# Patient Record
Sex: Male | Born: 1990
Health system: Southern US, Community
[De-identification: ages and names within clinical notes are randomized; demographics above are authoritative.]

## PROBLEM LIST (undated history)

## (undated) HISTORY — PX: WRIST SURGERY: SHX841

## (undated) HISTORY — PX: FOOT SURGERY: SHX648

---
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
## 2010-01-11 IMAGING — CT CT OF THE RIGHT SHOULDER WITHOUT CONTRAST
3 series · 16 of 33 positions shown, 19 images · non-contrast
Comparison: none

REASON FOR EXAM: RT shoulder pain  injury  AC joint separation need
report asap FU appt w dr ...
COMMENTS:

[Series 2: shoulder 2.0 b31s · axial · 0.48mm/px · z∈[+566,+654]mm · 8 of 53 slices shown, 10 images]
[im 5/53  soft-tissue]
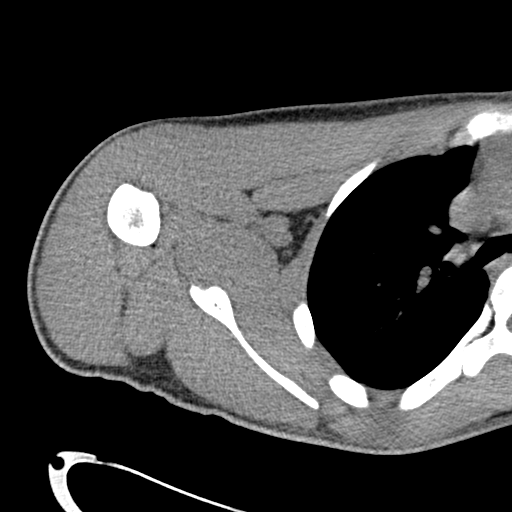
[im 5/53  bone]
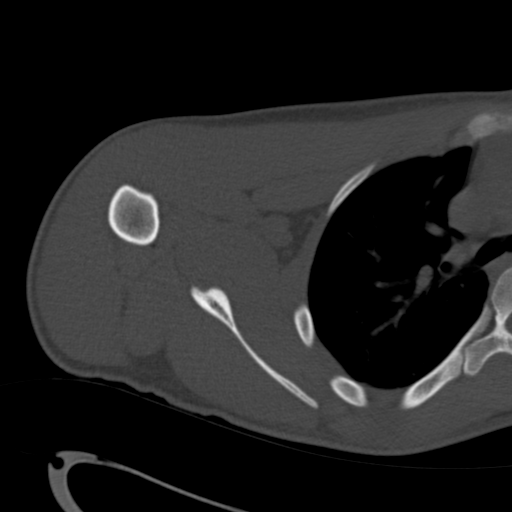
[im 13/53  bone]
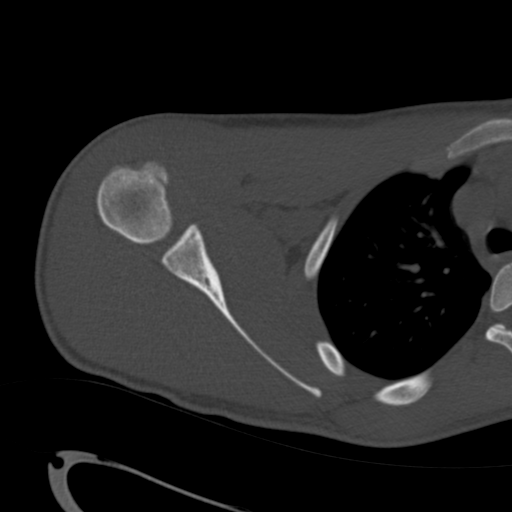
[im 17/53  bone]
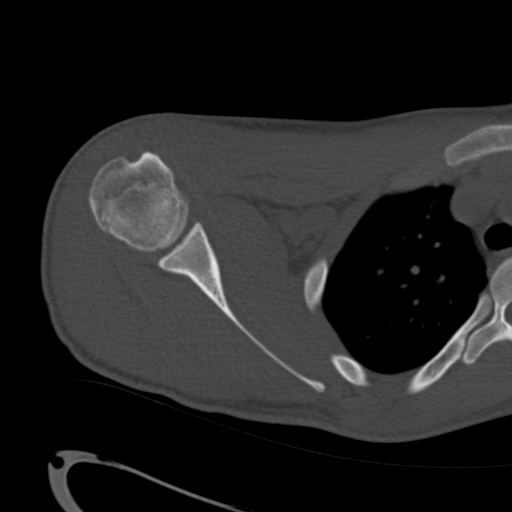
[im 25/53  bone]
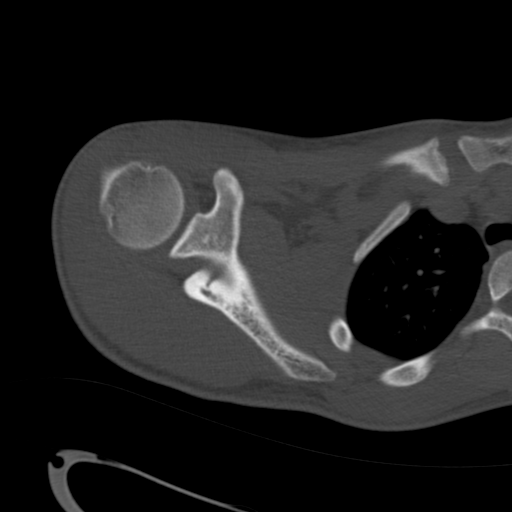
[im 29/53  soft-tissue]
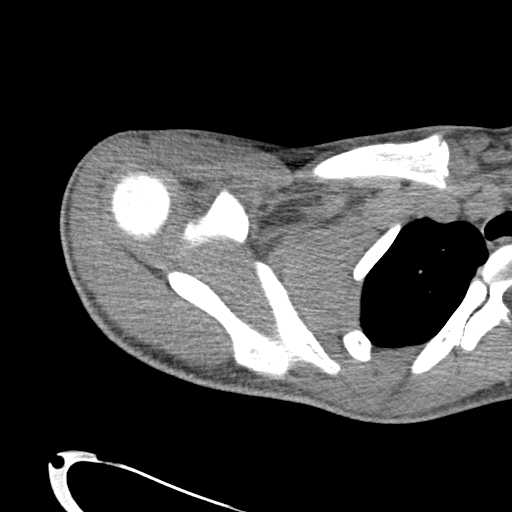
[im 29/53  bone]
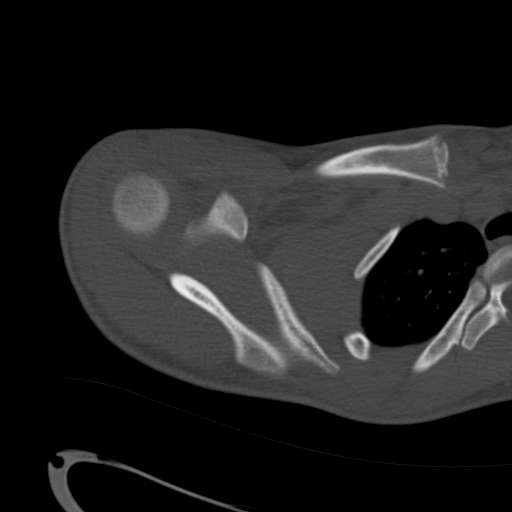
[im 37/53  bone]
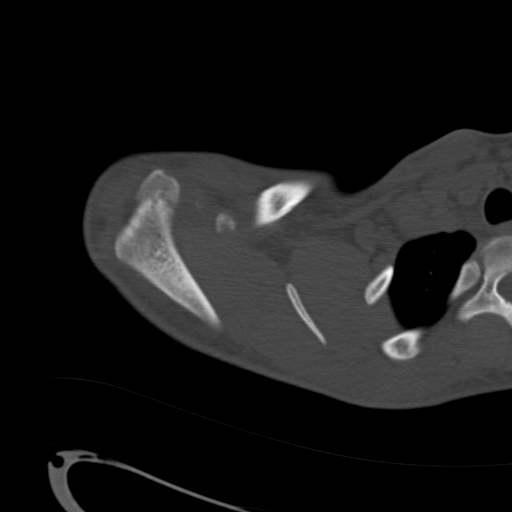
[im 41/53  bone]
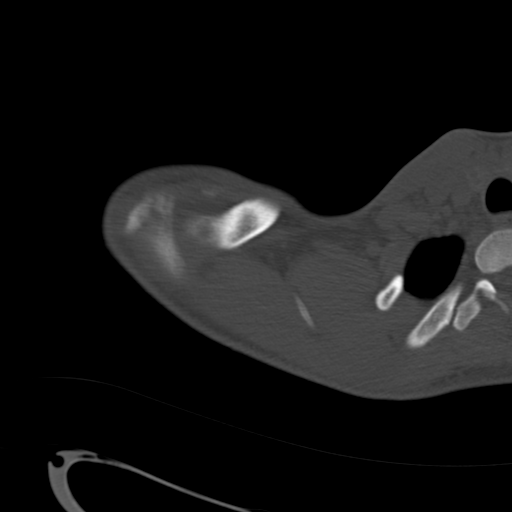
[im 49/53  bone]
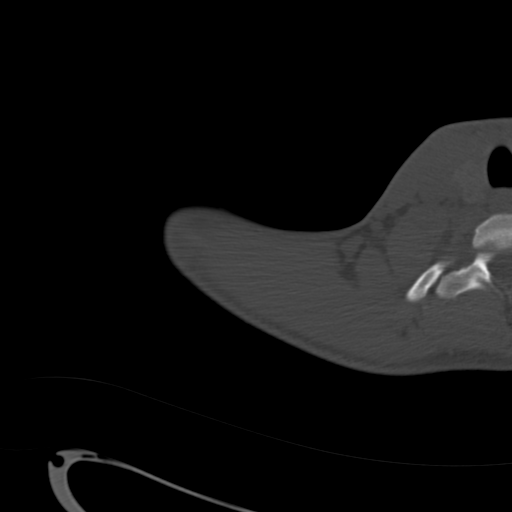

[Series 602: coronal · coronal · 0.48mm/px · 3 of 46 slices shown]
[im 10/46  bone]
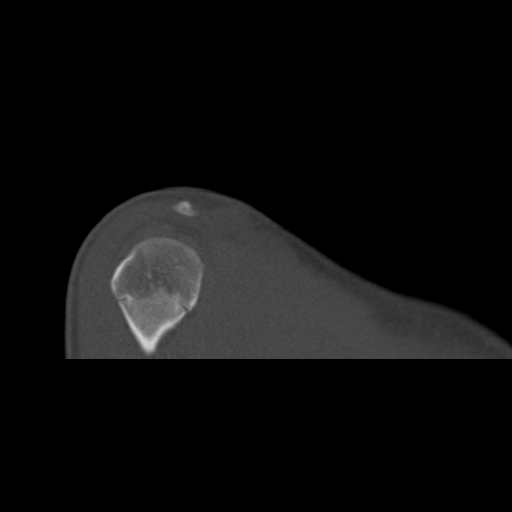
[im 19/46  bone]
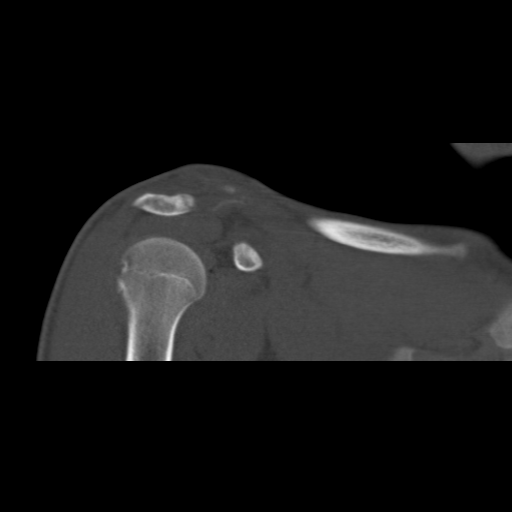
[im 28/46  bone]
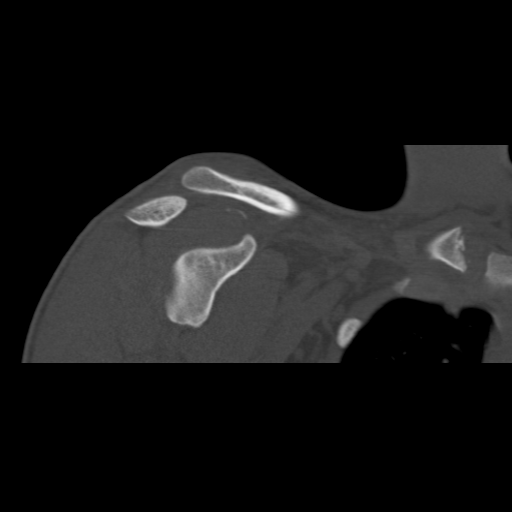

[Series 603: sag · sagittal · 0.48mm/px · 5 of 71 slices shown, 6 images]
[im 24/71  bone]
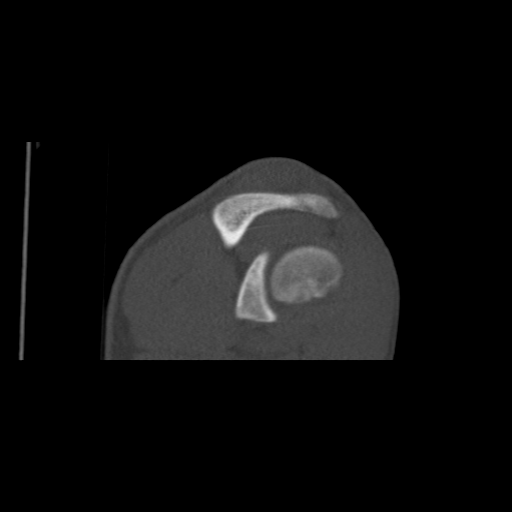
[im 30/71  bone]
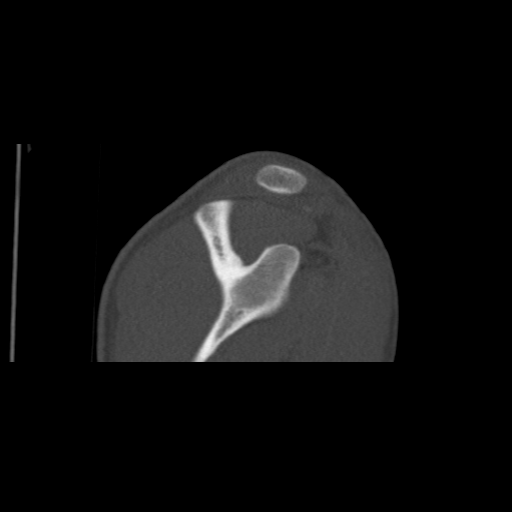
[im 36/71  soft-tissue]
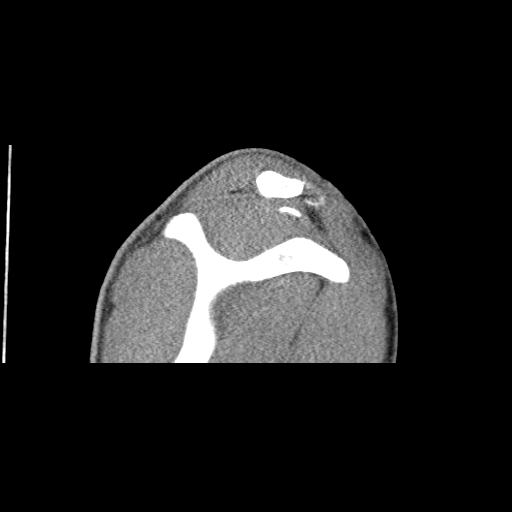
[im 36/71  bone]
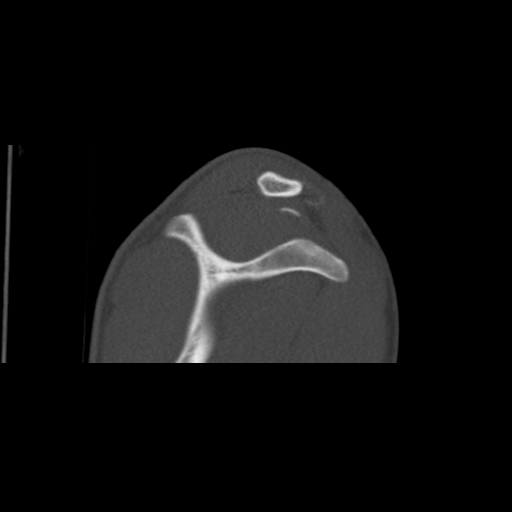
[im 41/71  bone]
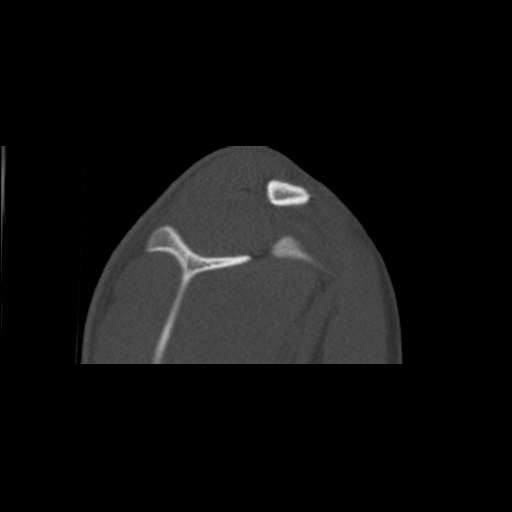
[im 47/71  bone]
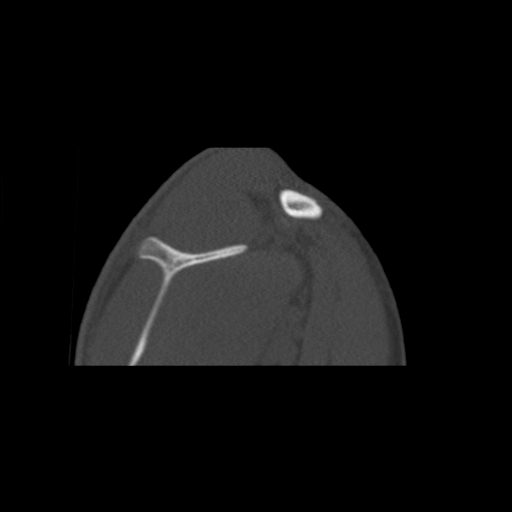

[16 of 33 positions shown; findings below may reference images not displayed]

PROCEDURE:     CT  - CT SHOULDER RIGHT WO  - February 25, 2009  [DATE]

RESULT:     Multislice helical acquisition is reconstructed in the axial,
sagittal and coronal planes. There is AC separation with elevation of the
distal portion of the clavicle completely above the level of the acromion.
There is avulsion most likely from the coracoid line inferior to the
clavicle. Additional bony fragments are seen which could have come from the
distal clavicle or coracoid. No AC joint loose body is evident. There is a
tiny fracture through the distal portion of the acromion without is
significant distraction.
IMPRESSION: Fracture/dislocation involving the acromioclavicular joint with avulsion
from the coracoid as described on the previous plain films. The patient has
undergone previous MR of the hand.

## 2012-05-20 ENCOUNTER — Emergency Department: Payer: Self-pay | Admitting: *Deleted

## 2012-05-24 ENCOUNTER — Emergency Department: Payer: Self-pay | Admitting: Emergency Medicine

## 2012-05-24 LAB — URINALYSIS, COMPLETE
Bacteria: NONE SEEN
Bilirubin,UR: NEGATIVE
Glucose,UR: NEGATIVE mg/dL (ref 0–75)
Ketone: NEGATIVE
Nitrite: NEGATIVE
Ph: 6 (ref 4.5–8.0)
Protein: NEGATIVE
RBC,UR: <1 /HPF (ref 0–5)
Specific Gravity: 1.027 (ref 1.003–1.030)
WBC UR: 2 /HPF (ref 0–5)

## 2013-04-09 IMAGING — US US PELVIS LIMITED
1 series · 14 of 25 positions shown · non-contrast
Comparison: none

REASON FOR EXAM: swelling/pain in testicle
COMMENTS:

PROCEDURE:     US  - US TESTICULAR  - May 24, 2012 [DATE]
RESULT:

[Series 1: us pelvis limited · 0.08mm/px · 14 of 44 slices shown]
[im 1/44]
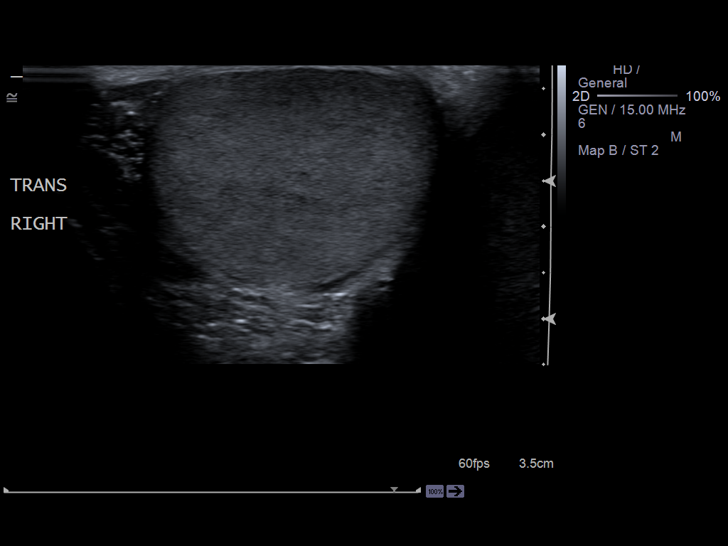
[im 4/44]
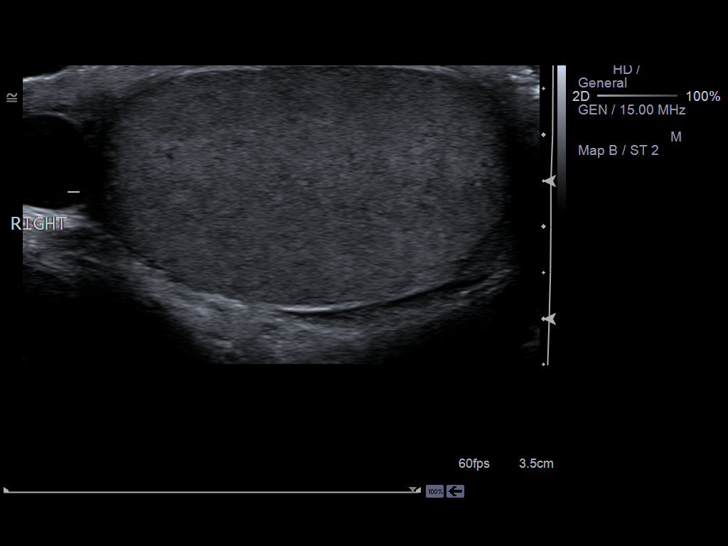
[im 8/44]
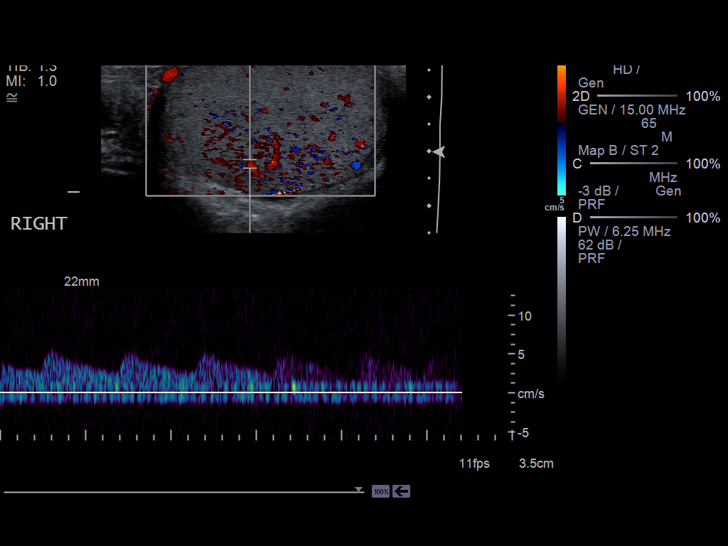
[im 11/44]
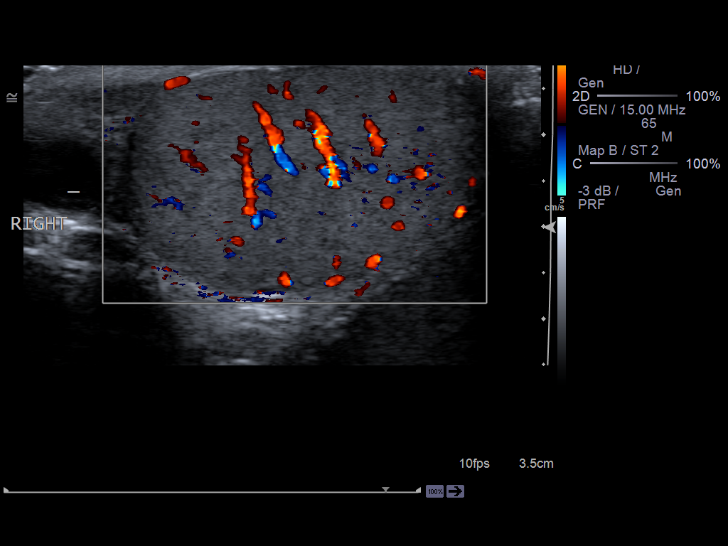
[im 15/44]
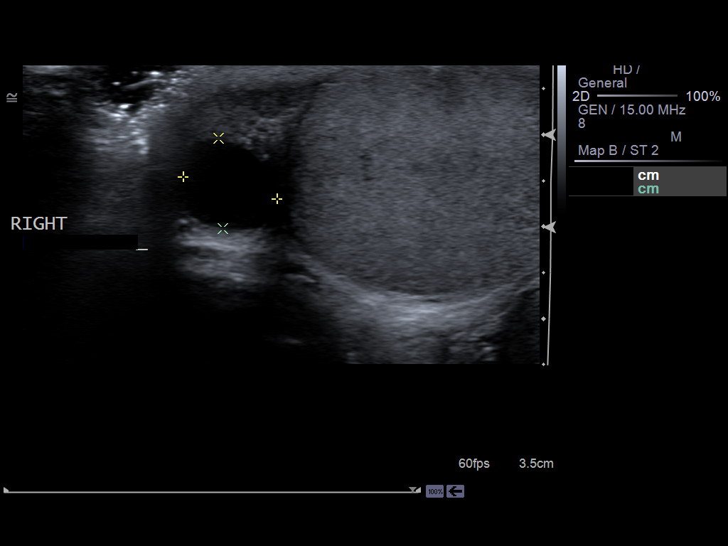
[im 17/44]
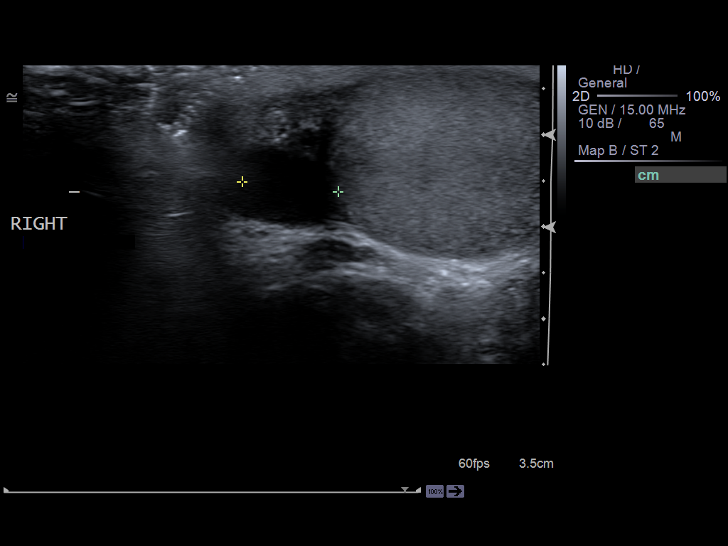
[im 20/44]
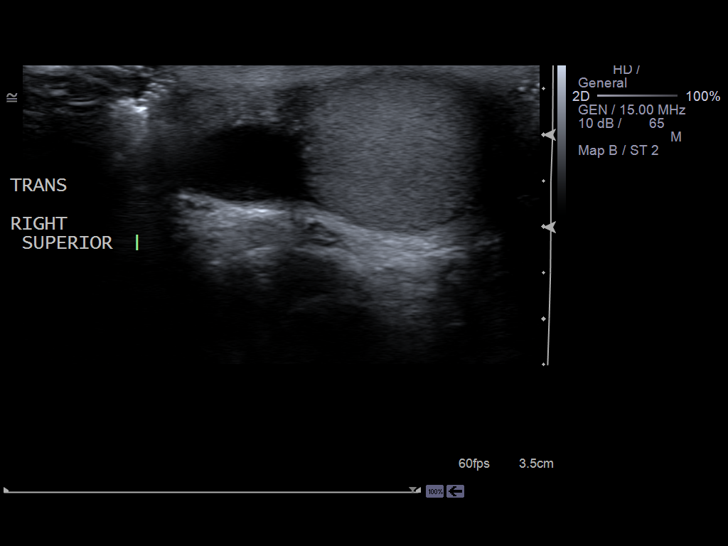
[im 24/44]
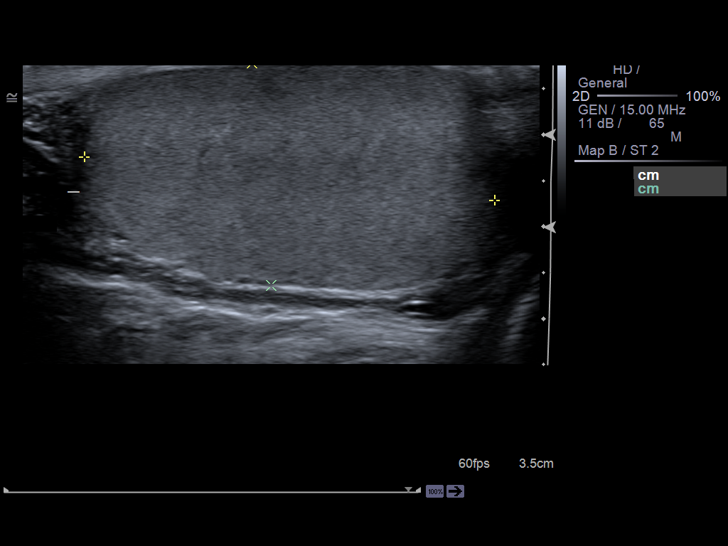
[im 27/44]
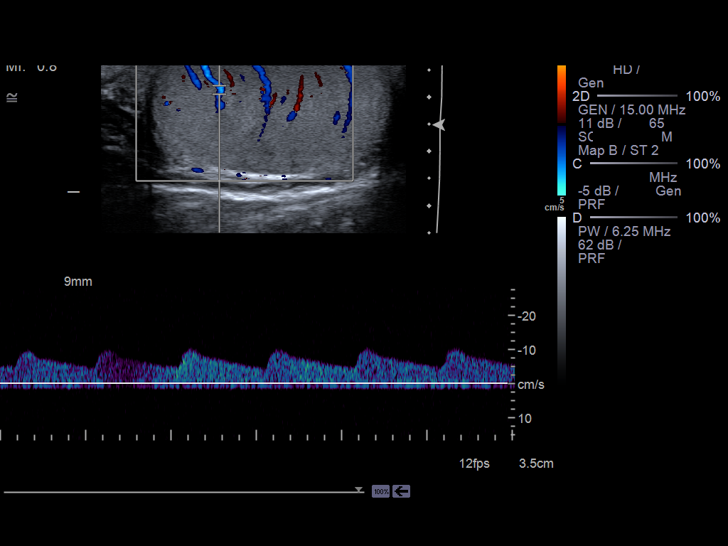
[im 29/44]
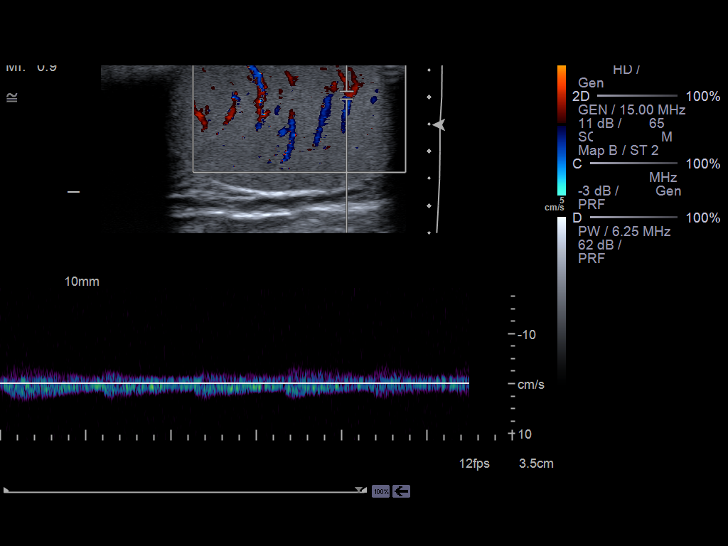
[im 33/44]
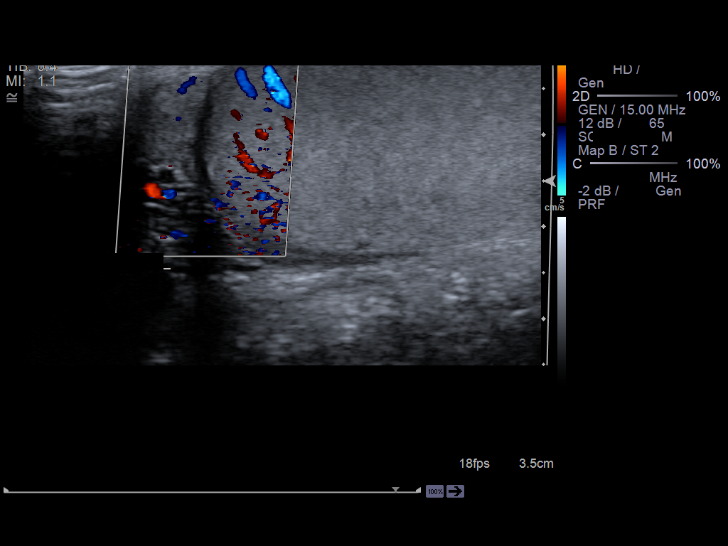
[im 36/44]
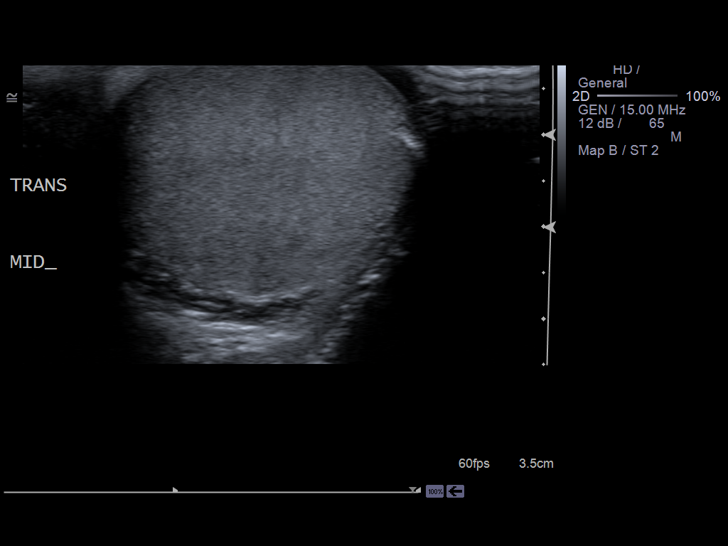
[im 40/44]
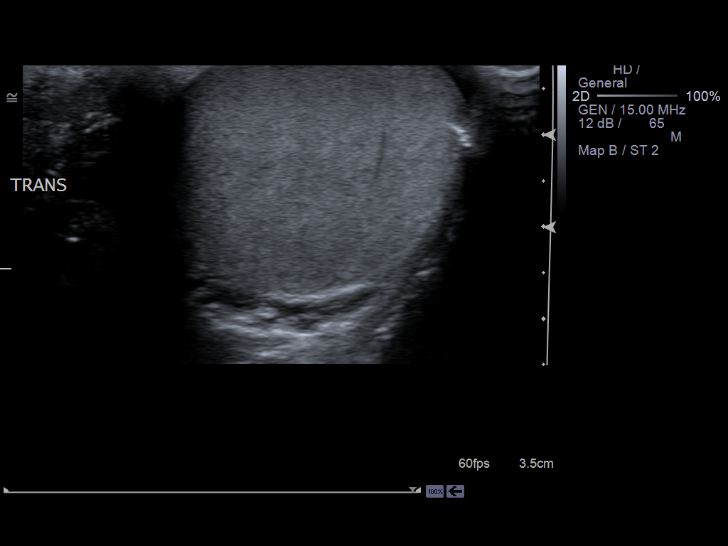
[im 44/44]
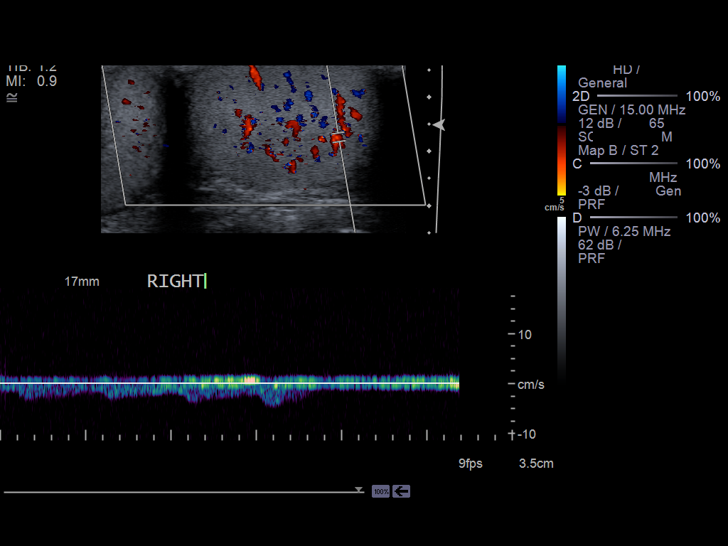

[14 of 25 positions shown; findings below may reference images not displayed]

FINDINGS: The right testicle measures 4.26 x 2.78 x 2.63 cm and the left
4.48 x 2.87 x 2.43 cm. The right epididymis measures 1.94 x 1.30 x 1.21 cm
and the left 1.34 x .71 x .80 cm. A right epididymal cyst is identified
measuring 1.05 x .98 x 1.05. There is no evidence of hydrocele or varicocele
on the right or left. The testicles and epididymides otherwise demonstrate a
homogeneous echotexture. Color filling is identified of the vasculature
within these structures. Arterial and venous waveforms are also identified.
IMPRESSION: 1. Unremarkable testicular ultrasound.
2. Dr. Slavucila of the Emergency Department was informed of these findings
via a preliminary faxed report dated 05/24/2012 at [DATE] p.m. EDT.

## 2015-08-26 ENCOUNTER — Emergency Department
Admission: EM | Admit: 2015-08-26 | Discharge: 2015-08-26 | Disposition: A | Discharge status: Home / Self Care | Payer: Self-pay | Attending: Emergency Medicine | Admitting: Emergency Medicine

## 2015-08-26 ENCOUNTER — Encounter: Payer: Self-pay | Admitting: Emergency Medicine

## 2015-08-26 DIAGNOSIS — R300 Dysuria: Secondary | ICD-10-CM | POA: Insufficient documentation

## 2015-08-26 DIAGNOSIS — Z880 Allergy status to penicillin: Secondary | ICD-10-CM | POA: Insufficient documentation

## 2015-08-26 LAB — URINALYSIS COMPLETE WITH MICROSCOPIC (ARMC ONLY)
Bacteria, UA: NONE SEEN
Bilirubin Urine: NEGATIVE
Glucose, UA: NEGATIVE mg/dL
Hgb urine dipstick: NEGATIVE
Ketones, ur: NEGATIVE mg/dL
Nitrite: NEGATIVE
Protein, ur: NEGATIVE mg/dL
SPECIFIC GRAVITY, URINE: 1.009 (ref 1.005–1.030)
pH: 8 (ref 5.0–8.0)

## 2015-08-26 NOTE — ED Provider Notes (Signed)
[facility] Emergency Department Provider Note  ____________________________________________  Time seen: Approximately 10:16 AM  I have reviewed the triage vital signs and the nursing notes.   HISTORY  Chief Complaint Dysuria    HPI Clemon Devaul is a 24 y.o. male patient complain of intermittent dysuria for 2 weeks. Patient stated he noticed dysuria is only present when he drinks sodas. Patient state taking water does not produce his complaint. Patient denies any penial discharge or high risk sexual activity. Patient describes the pain as a burning sensation. Patient rates his overall pain discomfort as a 1/10. No palliative measures taken for this complaint.Patient had a circumcision 8 months ago. History reviewed. No pertinent past medical history.  There are no active problems to display for this patient.   History reviewed. No pertinent past surgical history.  No current outpatient prescriptions on file.  Allergies Penicillins  No family history on file.  Social History Social History  Substance Use Topics  . Smoking status: Never Smoker   . Smokeless tobacco: None  . Alcohol Use: No    Review of Systems Constitutional: No fever/chills Eyes: No visual changes. ENT: No sore throat. Cardiovascular: Denies chest pain. Respiratory: Denies shortness of breath. Gastrointestinal: No abdominal pain.  No nausea, no vomiting.  No diarrhea.  No constipation. Genitourinary: Negative for dysuria. Musculoskeletal: Negative for back pain. Skin: Negative for rash. Neurological: Negative for headaches, focal weakness or numbness.  10-point ROS otherwise negative.  ____________________________________________   PHYSICAL EXAM:  VITAL SIGNS: ED Triage Vitals  Enc Vitals Group     BP 08/26/15 0916 119/75 mmHg     Pulse Rate 08/26/15 0916 82     Resp 08/26/15 0916 16     Temp 08/26/15 0916 98.2 F (36.8 C)     Temp Source 08/26/15 0916 Oral   SpO2 08/26/15 0916 99 %     Weight 08/26/15 0916 175 lb (79.379 kg)     Height 08/26/15 0916 5\' 7"  (1.702 m)     Head Cir --      Peak Flow --      Pain Score 08/26/15 0920 1     Pain Loc --      Pain Edu? --      Excl. in GC? --     Constitutional: Alert and oriented. Well appearing and in no acute distress. Eyes: Conjunctivae are normal. PERRL. EOMI. Head: Atraumatic. Nose: No congestion/rhinnorhea. Mouth/Throat: Mucous membranes are moist.  Oropharynx non-erythematous. Neck: No stridor.   Cardiovascular: Normal rate, regular rhythm. Grossly normal heart sounds.  Good peripheral circulation. Respiratory: Normal respiratory effort.  No retractions. Lungs CTAB. Gastrointestinal: Soft and nontender. No distention. No abdominal bruits. No CVA tenderness. Genitourinary: No penial lesions. No discharge expressed from the urethra. No inguinal adenopathy Musculoskeletal: No lower extremity tenderness nor edema.  No joint effusions. Neurologic:  Normal speech and language. No gross focal neurologic deficits are appreciated. No gait instability. Skin:  Skin is warm, dry and intact. No rash noted. Psychiatric: Mood and affect are normal. Speech and behavior are normal.  ____________________________________________   LABS (all labs ordered are listed, but only abnormal results are displayed)  Labs Reviewed  URINALYSIS COMPLETEWITH MICROSCOPIC ([facility] ONLY) - Abnormal; Notable for the following:    Color, Urine YELLOW (*)    APPearance CLEAR (*)    Leukocytes, UA TRACE (*)    Squamous Epithelial / LPF 0-5 (*)    All other components within normal limits   ____________________________________________  EKG  ____________________________________________  RADIOLOGY   ____________________________________________   PROCEDURES  Procedure(s) performed: None  Critical Care performed: No  ____________________________________________   INITIAL IMPRESSION / ASSESSMENT AND PLAN /  ED COURSE  Pertinent labs & imaging results that were available during my care of the patient were reviewed by me and considered in my medical decision making (see chart for details).  Have the nurse call the lab since the urine was collected at 0922 hrs. does not show received and notify the patient of the delay. Time is 11:30. Discussed negative urinalysis results with patient. In view of patient recent surgical history advised follow-up with urology. ____________________________________________   FINAL CLINICAL IMPRESSION(S) / ED DIAGNOSES  Final diagnoses:  Dysuria      Joni Reining, PA-C 08/26/15 1152  Arnaldo Natal, MD 08/26/15 [phone]

## 2015-08-26 NOTE — ED Notes (Signed)
Pt with painful urination for two weeks.

## 2016-10-28 ENCOUNTER — Encounter: Payer: Self-pay | Admitting: Emergency Medicine

## 2016-10-28 ENCOUNTER — Emergency Department
Admission: EM | Admit: 2016-10-28 | Discharge: 2016-10-28 | Disposition: A | Discharge status: Home / Self Care | Payer: Self-pay | Attending: Emergency Medicine | Admitting: Emergency Medicine

## 2016-10-28 DIAGNOSIS — H109 Unspecified conjunctivitis: Secondary | ICD-10-CM | POA: Insufficient documentation

## 2016-10-28 MED ORDER — SULFACETAMIDE SODIUM 10 % OP SOLN: 2.0000 [drp] | Freq: Four times a day (QID) | OPHTHALMIC | 0 refills | Status: DC

## 2016-10-28 NOTE — ED Provider Notes (Signed)
[facility] Emergency Department Provider Note  ____________________________________________   First MD Initiated Contact with Patient 10/28/16 0532     (approximate)  I have reviewed the triage vital signs and the nursing notes.   HISTORY  Chief Complaint Conjunctivitis   HPI Daniel Hatfield is a 25 y.o. male without any chronic medical conditions was presenting with left eyebrow redness and irritation. He says that he has also had a runny nose and cough. He also has a 6833-month-old infant at home. He has been using homeopathic over-the-counter medicine without any relief. Denies any fever. Denies any vision change or pain to the left eye. Says that he had some crusting but the eye was not crusted shut.   History reviewed. No pertinent past medical history.  There are no active problems to display for this patient.   Past Surgical History:  Procedure Laterality Date  . FOOT SURGERY Left   . WRIST SURGERY Right     Prior to Admission medications   Not on File    Allergies Penicillins  No family history on file.  Social History Social History  Substance Use Topics  . Smoking status: Never Smoker  . Smokeless tobacco: Never Used  . Alcohol use No    Review of Systems Constitutional: No fever/chills Eyes: No visual changes. ENT: No sore throat. Cardiovascular: Denies chest pain. Respiratory: Denies shortness of breath. Gastrointestinal: No abdominal pain.  No nausea, no vomiting.  No diarrhea.  No constipation. Genitourinary: Negative for dysuria. Musculoskeletal: Negative for back pain. Skin: Negative for rash. Neurological: Negative for headaches, focal weakness or numbness.  10-point ROS otherwise negative.  ____________________________________________   PHYSICAL EXAM:  VITAL SIGNS: ED Triage Vitals  Enc Vitals Group     BP 10/28/16 0520 136/60     Pulse Rate 10/28/16 0520 71     Resp 10/28/16 0520 18     Temp 10/28/16 0520  97.7 F (36.5 C)     Temp Source 10/28/16 0520 Oral     SpO2 10/28/16 0520 99 %     Weight 10/28/16 0521 185 lb (83.9 kg)     Height 10/28/16 0521 5\' 7"  (1.702 m)     Head Circumference --      Peak Flow --      Pain Score --      Pain Loc --      Pain Edu? --      Excl. in GC? --     Constitutional: Alert and oriented. Well appearing and in no acute distress. Eyes: Mild to moderate erythema to the left eye without any discharge or crusting at this time. PERRL. EOMI. Head: Atraumatic. Nose: No congestion/rhinnorhea. Mouth/Throat: Mucous membranes are moist.   Neck: No stridor.   Cardiovascular: Normal rate, regular rhythm. Grossly normal heart sounds.  Respiratory: Normal respiratory effort.  No retractions. Lungs CTAB. Gastrointestinal: Soft and nontender. No distention.  Musculoskeletal: No lower extremity tenderness nor edema.  No joint effusions. Neurologic:  Normal speech and language. No gross focal neurologic deficits are appreciated. No gait instability. Skin:  Skin is warm, dry and intact. No rash noted. Psychiatric: Mood and affect are normal. Speech and behavior are normal.  ____________________________________________   LABS (all labs ordered are listed, but only abnormal results are displayed)  Labs Reviewed - No data to display ____________________________________________  EKG   ____________________________________________  RADIOLOGY   ____________________________________________   PROCEDURES  Procedure(s) performed:   Procedures  Critical Care performed:   ____________________________________________  INITIAL IMPRESSION / ASSESSMENT AND PLAN / ED COURSE  Pertinent labs & imaging results that were available during my care of the patient were reviewed by me and considered in my medical decision making (see chart for details).    Clinical Course   Likely viral cause of the patient's right eye but the patient says [place] and wanted to get an eyedrop just in case. Because he will be likely remote from medical care I will be prescribing him a prescription for sulfacetamide. We also discussed hand hygiene and making sure that he is very careful about such as eye and then his new, 7771-month-old, infant.  He is aware of the highly contagious nature of the condition.   ____________________________________________   FINAL CLINICAL IMPRESSION(S) / ED DIAGNOSES  Conjunctivitis.    NEW MEDICATIONS STARTED DURING THIS VISIT:  New Prescriptions   No medications on file     Note:  This document was prepared using Dragon voice recognition software and may include unintentional dictation errors.    Myrna Blazeravid Matthew Junita Kubota, MD 10/28/16 [phone]

## 2016-10-28 NOTE — ED Notes (Signed)
Pt discharged to home.  Discharge instructions reviewed.  Verbalized understanding.  No questions or concerns at this time.  Teach back verified.  Pt in NAD.  No items left in ED.   

## 2016-10-28 NOTE — ED Triage Notes (Signed)
Pt ambulatory to triage with steady gait with c/o conjunctivitis in left eye since this morning. Redness noted to left eye.

## 2017-06-12 ENCOUNTER — Encounter: Payer: Self-pay | Admitting: Emergency Medicine

## 2017-06-12 ENCOUNTER — Ambulatory Visit
Admission: EM | Admit: 2017-06-12 | Discharge: 2017-06-12 | Disposition: A | Discharge status: Home / Self Care | Payer: 59 | Attending: Family Medicine | Admitting: Family Medicine

## 2017-06-12 DIAGNOSIS — R05 Cough: Secondary | ICD-10-CM

## 2017-06-12 DIAGNOSIS — J069 Acute upper respiratory infection, unspecified: Secondary | ICD-10-CM

## 2017-06-12 DIAGNOSIS — B349 Viral infection, unspecified: Secondary | ICD-10-CM | POA: Diagnosis not present

## 2017-06-12 MED ORDER — AZITHROMYCIN 250 MG PO TABS: 250.0000 mg | ORAL_TABLET | Freq: Every day | ORAL | 0 refills | Status: DC

## 2017-06-12 MED ORDER — PSEUDOEPH-BROMPHEN-DM 30-2-10 MG/5ML PO SYRP: 5.0000 mL | ORAL_SOLUTION | Freq: Four times a day (QID) | ORAL | 0 refills | Status: DC | PRN

## 2017-06-12 NOTE — ED Provider Notes (Signed)
CSN: [id number]     Arrival date & time 06/12/17  1354 History   First MD Initiated Contact with Patient 06/12/17 1408     Chief Complaint  Patient presents with  . Headache  . Cough   (Consider location/radiation/quality/duration/timing/severity/associated sxs/prior Treatment) HPI  26 year old male presents to the urgent care facility for evaluation of cough and runny nose and headache that has been present for 2 days. Symptoms are mild. Headache is diffuse, pain is mild and intermittent, not the worse headache of his life. No vision changes. He had similar symptoms that developed 9 days ago that lasted 7 days. He had a 2 day period without symptoms. Her last 2 days he's had nonproductive cough with runny nose and headache. He has had mild improvement with NyQuil. Not taking any other medications. He denies any fevers, chest pain, shortness of breath. No difficulty swallowing, abdominal pain, nausea vomiting or diarrhea. No skin rashes. History reviewed. No pertinent past medical history. Past Surgical History:  Procedure Laterality Date  . FOOT SURGERY Left   . WRIST SURGERY Right    History reviewed. No pertinent family history. Social History  Substance Use Topics  . Smoking status: Never Smoker  . Smokeless tobacco: Never Used  . Alcohol use No    Review of Systems  Constitutional: Negative.  Negative for activity change, appetite change, chills and fever.  HENT: Positive for congestion and rhinorrhea. Negative for ear pain, mouth sores, sinus pressure, sore throat and trouble swallowing.   Eyes: Negative for photophobia, pain, discharge and visual disturbance.  Respiratory: Positive for cough. Negative for chest tightness and shortness of breath.   Cardiovascular: Negative for chest pain and leg swelling.  Gastrointestinal: Negative for abdominal distention, abdominal pain, diarrhea, nausea and vomiting.  Genitourinary: Negative for difficulty urinating and dysuria.   Musculoskeletal: Negative for arthralgias, back pain and gait problem.  Skin: Negative for color change and rash.  Neurological: Positive for headaches. Negative for dizziness.  Hematological: Negative for adenopathy.  Psychiatric/Behavioral: Negative for agitation and behavioral problems.    Allergies  Penicillins  Home Medications   Prior to Admission medications   Medication Sig Start Date End Date Taking? Authorizing Provider  azithromycin (ZITHROMAX) 250 MG tablet Take 1 tablet (250 mg total) by mouth daily. Take first 2 tablets together, then 1 every day until finished. 06/12/17   Evon SlackGaines, Myrle Dues C, PA-C  brompheniramine-pseudoephedrine-DM 30-2-10 MG/5ML syrup Take 5 mLs by mouth 4 (four) times daily as needed. 06/12/17   Evon SlackGaines, Krishika Bugge C, PA-C   Meds Ordered and Administered this Visit  Medications - No data to display  BP 118/75 (BP Location: Left Arm)   Pulse 94   Temp 98.9 F (37.2 C) (Oral)   Resp 16   Ht 5\' 6"  (1.676 m)   Wt 180 lb (81.6 kg)   SpO2 99%   BMI 29.05 kg/m  No data found.   Physical Exam  Constitutional: He is oriented to person, place, and time. He appears well-developed and well-nourished.  HENT:  Head: Normocephalic and atraumatic.  Right Ear: External ear normal.  Left Ear: External ear normal.  Nose: Rhinorrhea present. No mucosal edema.  Mouth/Throat: Oropharynx is clear and moist. No trismus in the jaw. No uvula swelling. No oropharyngeal exudate or tonsillar abscesses.  Eyes: Conjunctivae and EOM are normal. Right eye exhibits no discharge. Left eye exhibits no discharge.  Neck: Normal range of motion. Neck supple.  Cardiovascular: Normal rate, regular rhythm, normal heart sounds and intact distal  pulses.   Pulmonary/Chest: Effort normal and breath sounds normal. No respiratory distress. He has no wheezes. He has no rales. He exhibits no tenderness.  Abdominal: Soft. Bowel sounds are normal. He exhibits no distension. There is no tenderness.   Musculoskeletal: Normal range of motion. He exhibits no edema or tenderness.  Lymphadenopathy:    He has cervical adenopathy (posterior).  Neurological: He is alert and oriented to person, place, and time.  Skin: Skin is warm and dry.  Psychiatric: He has a normal mood and affect. His behavior is normal. Judgment and thought content normal.    Urgent Care Course     Procedures (including critical care time)  Labs Review Labs Reviewed - No data to display  Imaging Review No results found.   Visual Acuity Review  Right Eye Distance:   Left Eye Distance:   Bilateral Distance:    Right Eye Near:   Left Eye Near:    Bilateral Near:         MDM   1. Viral upper respiratory tract infection    26 -year-old male with upper respiratory infection. Vital signs are normal, lungs are clear to auscultation. The sign of bacterial infection on exam. Given prescription for Bromfed. He will increase fluids. He is educated on signs and symptoms to return to clinic for. Zpack postdated, if symptoms have not improved in 48 hrs, he can start antibiotic.   Evon Slack, PA-C 06/12/17 1430

## 2017-06-12 NOTE — ED Triage Notes (Signed)
Patient c/o cough, runny nose, and HAs that started a week ago.

## 2017-06-12 NOTE — Discharge Instructions (Signed)
Please take cough medication as prescribed. If no improvement of symptoms within 48 hours, start antibiotic. If improving symptoms in 48 hours did not start antibiotic. Return to the clinic, emergency department for any fevers, worsening symptoms urgent changes in her health.

## 2019-10-01 ENCOUNTER — Ambulatory Visit
Admission: EM | Admit: 2019-10-01 | Discharge: 2019-10-01 | Disposition: A | Discharge status: Home / Self Care | Payer: BLUE CROSS/BLUE SHIELD | Attending: Family Medicine | Admitting: Family Medicine

## 2019-10-01 ENCOUNTER — Other Ambulatory Visit: Payer: Self-pay

## 2019-10-01 DIAGNOSIS — M545 Low back pain, unspecified: Secondary | ICD-10-CM

## 2019-10-01 DIAGNOSIS — X500XXA Overexertion from strenuous movement or load, initial encounter: Secondary | ICD-10-CM | POA: Diagnosis not present

## 2019-10-01 MED ORDER — METAXALONE 800 MG PO TABS: 800.0000 mg | ORAL_TABLET | Freq: Three times a day (TID) | ORAL | 0 refills | Status: DC | PRN

## 2019-10-01 MED ORDER — MELOXICAM 15 MG PO TABS: 15.0000 mg | ORAL_TABLET | Freq: Every day | ORAL | 0 refills | Status: DC | PRN

## 2019-10-01 NOTE — ED Triage Notes (Signed)
Pt states he has had low back pain x past 3 days. This morning the pain is traveling down the back of both of his legs to his knees. Pain 6/10. Feels like a strong ache. Reports low back is now sore to the touch

## 2019-10-01 NOTE — ED Provider Notes (Signed)
[facility]    CSN: [id number] Arrival date & time: 10/01/19  4098  History   Chief Complaint Chief Complaint  Patient presents with  . Back Pain   HPI  28 year old male presents with low back pain.  3-day history of low back pain.  Rates his pain as 6/10 in severity.  Located bilaterally.  He reports that it radiates down the back of his legs.  Worse when he forward flexes/bends forward.  Improves when he sits up straight.  He has taken Tylenol and used heat with some improvement.  No reports of numbness or tingling.  No incontinence.  He does not recall recent fall or injury.  However, he did note some pain after he bent down to bathe his kids in the bathtub.  Describes the pain as achy.  No other associated symptoms.  No other complaints.  PMH, Surgical Hx, Family Hx, Social History reviewed and updated as below.  PMH: Hx of de Quervain's tenosynovitis  Past Surgical History:  Procedure Laterality Date  . FOOT SURGERY Left   . WRIST SURGERY Right    Home Medications    Prior to Admission medications   Medication Sig Start Date End Date Taking? Authorizing Provider  meloxicam (MOBIC) 15 MG tablet Take 1 tablet (15 mg total) by mouth daily as needed for pain. 10/01/19   Tommie Sams, DO  metaxalone (SKELAXIN) 800 MG tablet Take 1 tablet (800 mg total) by mouth 3 (three) times daily as needed for muscle spasms. 10/01/19   Tommie Sams, DO   Social History Social History   Tobacco Use  . Smoking status: Never Smoker  . Smokeless tobacco: Never Used  Substance Use Topics  . Alcohol use: No  . Drug use: No   Allergies   Penicillins   Review of Systems Review of Systems  Constitutional: Negative.   Musculoskeletal: Positive for back pain.   Physical Exam Triage Vital Signs ED Triage Vitals  Enc Vitals Group     BP 10/01/19 0939 116/63     Pulse Rate 10/01/19 0939 77     Resp 10/01/19 0939 16     Temp 10/01/19 0939 98.3 F (36.8 C)     Temp  Source 10/01/19 0939 Oral     SpO2 10/01/19 0939 99 %     Weight 10/01/19 0938 183 lb (83 kg)     Height 10/01/19 0938 5\' 7"  (1.702 m)     Head Circumference --      Peak Flow --      Pain Score 10/01/19 0938 6     Pain Loc --      Pain Edu? --      Excl. in GC? --    Updated Vital Signs BP 116/63 (BP Location: Right Arm)   Pulse 77   Temp 98.3 F (36.8 C) (Oral)   Resp 16   Ht 5\' 7"  (1.702 m)   Wt 83 kg   SpO2 99%   BMI 28.66 kg/m   Visual Acuity Right Eye Distance:   Left Eye Distance:   Bilateral Distance:    Right Eye Near:   Left Eye Near:    Bilateral Near:     Physical Exam Vitals signs and nursing note reviewed.  Constitutional:      General: He is not in acute distress.    Appearance: Normal appearance. He is not ill-appearing.  HENT:     Head: Normocephalic and atraumatic.  Eyes:  General:        Right eye: No discharge.        Left eye: No discharge.     Conjunctiva/sclera: Conjunctivae normal.  Cardiovascular:     Rate and Rhythm: Normal rate and regular rhythm.  Pulmonary:     Effort: Pulmonary effort is normal. No respiratory distress.     Breath sounds: Normal breath sounds. No wheezing, rhonchi or rales.  Musculoskeletal:     Comments: Lumbar spine-mild tenderness of the paraspinal musculature.  Decreased range of motion secondary to pain.  Neurological:     Mental Status: He is alert.  Psychiatric:        Mood and Affect: Mood normal.        Behavior: Behavior normal.    UC Treatments / Results  Labs (all labs ordered are listed, but only abnormal results are displayed) Labs Reviewed - No data to display  EKG   Radiology No results found.  Procedures Procedures (including critical care time)  Medications Ordered in UC Medications - No data to display  Initial Impression / Assessment and Plan / UC Course  I have reviewed the triage vital signs and the nursing notes.  Pertinent labs & imaging results that were available  during my care of the patient were reviewed by me and considered in my medical decision making (see chart for details).    28 year old male presents with acute low back pain.  Rest, ice, heat.  Meloxicam and Skelaxin as directed.  Work note given.  Supportive care.  Final Clinical Impressions(s) / UC Diagnoses   Final diagnoses:  Acute bilateral low back pain, unspecified whether sciatica present     Discharge Instructions     Rest, ice, heat.  Medications as directed.  Take care  Dr. Lacinda Axon    ED Prescriptions    Medication Sig Dispense Auth. Provider   meloxicam (MOBIC) 15 MG tablet Take 1 tablet (15 mg total) by mouth daily as needed for pain. 30 tablet Gabriellia Rempel G, DO   metaxalone (SKELAXIN) 800 MG tablet Take 1 tablet (800 mg total) by mouth 3 (three) times daily as needed for muscle spasms. 30 tablet Coral Spikes, DO     PDMP not reviewed this encounter.   Coral Spikes, DO 10/01/19 1013

## 2019-10-01 NOTE — Discharge Instructions (Addendum)
Rest, ice, heat.  Medications as directed.  Take care  Dr. Lacinda Axon

## 2019-10-03 ENCOUNTER — Telehealth: Payer: Self-pay | Admitting: Family Medicine

## 2019-10-03 MED ORDER — PREDNISONE 10 MG (21) PO TBPK: ORAL_TABLET | ORAL | 0 refills | Status: DC

## 2019-10-03 NOTE — Telephone Encounter (Signed)
Back pain not improving.  Sending in prednisone.  Take care  Dr. Lacinda Axon

## 2019-10-21 ENCOUNTER — Other Ambulatory Visit: Payer: Self-pay | Admitting: Family Medicine

## 2020-10-29 ENCOUNTER — Ambulatory Visit: Admit: 2020-10-29 | Discharge status: Against Medical Advice | Payer: Self-pay

## 2020-11-10 ENCOUNTER — Ambulatory Visit: Payer: BLUE CROSS/BLUE SHIELD | Admitting: Cardiology

## 2020-11-12 ENCOUNTER — Encounter: Payer: Self-pay | Admitting: Cardiology

## 2021-02-27 ENCOUNTER — Ambulatory Visit (INDEPENDENT_AMBULATORY_CARE_PROVIDER_SITE_OTHER): Payer: Self-pay | Admitting: Family Medicine

## 2021-02-27 ENCOUNTER — Ambulatory Visit: Payer: Self-pay | Admitting: Family Medicine

## 2021-02-27 ENCOUNTER — Encounter: Payer: Self-pay | Admitting: Family Medicine

## 2021-02-27 ENCOUNTER — Other Ambulatory Visit: Payer: Self-pay

## 2021-02-27 VITALS — BP 122/92 | HR 86 | Temp 98.4°F | Ht 67.0 in | Wt 183.0 lb

## 2021-02-27 DIAGNOSIS — F411 Generalized anxiety disorder: Secondary | ICD-10-CM

## 2021-02-27 DIAGNOSIS — N62 Hypertrophy of breast: Secondary | ICD-10-CM | POA: Insufficient documentation

## 2021-02-27 MED ORDER — HYDROXYZINE HCL 50 MG PO TABS: 50.0000 mg | ORAL_TABLET | Freq: Three times a day (TID) | ORAL | 3 refills | Status: AC | PRN

## 2021-02-27 NOTE — Progress Notes (Signed)
New Patient Office Visit  Subjective:  Patient ID: Daniel Hatfield, male    DOB: 08-03-1991  Age: 30 y.o. MRN: [id number]  CC:  Chief Complaint  Patient presents with  . New Patient (Initial Visit)  . Mass    Bilateral pectoral masses per patient; intermittent pain with touch; 2/10 pain in office    HPI Shylo Dillenbeck presents for establishment of care and concern over anxiety and longstanding gynecomastia.  In regards to anxiety he cites roughly 1 bout of feeling anxious involving palpitations, mild sweats, denies any overt chest pain, visual disturbance, syncopal or near syncopal events.  He cites specific stressful events such as family issues that come up from time to time, job-related stress, no SI/HI, no sleep disturbance.  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  Denies any type of discharge from his nipples, no masses, and does have family members who have had the same however not to the same degree as his.  He has looked into surgical options but due to financial constraints this is not feasible.   No past medical history on file.  Past Surgical History:  Procedure Laterality Date  . FOOT SURGERY Left   . WRIST SURGERY Right     Family History  Problem Relation Age of Onset  . Cataracts Mother   . Heart attack Father   . Stroke Father     Social History   Socioeconomic History  . Marital status: Married    Spouse name: Evie Crumpler  . Number of children: 1  . Years of education: 64  . Highest education level: Some college, no degree  Occupational History  . Not on file  Tobacco Use  . Smoking status: Never Smoker  . Smokeless tobacco: Never Used  Vaping Use  . Vaping Use: Never used  Substance and Sexual Activity  . Alcohol use: Never  .  Drug use: Never  . Sexual activity: Yes  Other Topics Concern  . Not on file  Social History Narrative  . Not on file   Social Determinants of Health   Financial Resource Strain: Not on file  Food Insecurity: Not on file  Transportation Needs: Not on file  Physical Activity: Not on file  Stress: Not on file  Social Connections: Not on file  Intimate Partner Violence: Not on file    ROS Review of Systems  Constitutional: Negative for appetite change, chills, fatigue and unexpected weight change.  HENT: Negative for ear pain, sore throat and trouble swallowing.   Eyes: Negative for visual disturbance.  Respiratory: Negative for cough, chest tightness and shortness of breath.   Cardiovascular: Positive for palpitations. Negative for chest pain and leg swelling.  Gastrointestinal: Negative for abdominal pain, constipation, diarrhea and nausea.  Genitourinary: Negative.   Musculoskeletal: Negative.   Skin: Negative.   Allergic/Immunologic: Positive for environmental allergies.  Neurological: Negative for headaches.  Hematological: Negative for adenopathy.  Psychiatric/Behavioral: Negative for agitation, dysphoric mood, self-injury and sleep disturbance. The patient is nervous/anxious.     Objective:   Today's Vitals: BP (!) 122/92 (BP Location: Right Arm, Patient Position: Sitting, Cuff Size: Normal)   Pulse 86   Temp 98.4 F (36.9 C) (Oral)   Ht 5\' 7"  (1.702 m)   Wt 183 lb (83 kg)  SpO2 97%   BMI 28.66 kg/m   Physical Exam General: Well Developed, well nourished, and in no acute distress.  Neuro: Alert and oriented x3, extra-ocular muscles intact, sensation grossly intact. Cranial nerves II through XII are intact, motor, sensory, and coordinative functions are grossly intact. HEENT: Normocephalic, atraumatic, pupils equal round reactive to light, neck supple, no masses, no lymphadenopathy, thyroid nonpalpable. Oropharynx, nasopharynx, external ear canals are  unremarkable. Bilateral tympanic membranes benign. Skin: Warm and dry, no rashes noted.  Cardiac: Regular rate and rhythm, no murmurs, rubs, or gallops. Chest Wall: Right greater than left gynecomastia, no palpable masses, no discharge expressed, no lymphadenopathy Respiratory: Clear to auscultation bilaterally. Not using accessory muscles, speaking in full sentences.  Abdominal: Soft, nontender, nondistended, positive bowel sounds, no masses, no organomegaly.  Musculoskeletal: Shoulder, elbow, wrist, hip, knee, ankle grossly normal, and with full range of motion.  Assessment & Plan:   Problem List Items Addressed This Visit      Other   Gynecomastia, male - Primary    Male patient with breast enlargement, lifelong, association with body weight though has never resolved, asymmetry is present.  He is not amenable to surgical treatment options due to financial barriers and is requesting pharmacotherapy.  To determine etiology of his symptoms I have advised serum studies, pending these results can discuss possible oral pharmacotherapy, could also consider imaging, referral as appropriate.  Patient is understanding and amenable to this plan moving forward.      Relevant Orders   Luteinizing hormone   Testosterone   Prolactin   Anxiety, generalized    Patient with stated anxiety with reassuring PHQ and GAD scores.  I reviewed nonpharmacologic as well as pharmacologic management options and given the sporadic nature of his complaints he will utilize hydroxyzine 3 times daily on an as needed basis, we can follow-up on this issue pending response and residual symptoms if present.      Relevant Medications   hydrOXYzine (ATARAX/VISTARIL) 50 MG tablet      Outpatient Encounter Medications as of 02/27/2021  Medication Sig  . hydrOXYzine (ATARAX/VISTARIL) 50 MG tablet Take 1 tablet (50 mg total) by mouth 3 (three) times daily as needed for itching.  . [DISCONTINUED] meloxicam (MOBIC) 15 MG tablet  Take 1 tablet (15 mg total) by mouth daily as needed for pain.  . [DISCONTINUED] metaxalone (SKELAXIN) 800 MG tablet Take 1 tablet (800 mg total) by mouth 3 (three) times daily as needed for muscle spasms.  . [DISCONTINUED] predniSONE (STERAPRED UNI-PAK 21 TAB) 10 MG (21) TBPK tablet 6 tablets on day 1; decrease by 1 tablet daily until gone.   No facility-administered encounter medications on file as of 02/27/2021.    Follow-up: Return if symptoms worsen or fail to improve.   Jerrol Banana, MD

## 2021-02-27 NOTE — Assessment & Plan Note (Signed)
Male patient with breast enlargement, lifelong, association with body weight though has never resolved, asymmetry is present.  He is not amenable to surgical treatment options due to financial barriers and is requesting pharmacotherapy.  To determine etiology of his symptoms I have advised serum studies, pending these results can discuss possible oral pharmacotherapy, could also consider imaging, referral as appropriate.  Patient is understanding and amenable to this plan moving forward.

## 2021-02-27 NOTE — Patient Instructions (Signed)
-   Get labs done with order provided - Can use hydroxyzine up to three times a day as-needed for anxiety - Contact our office to review labs - Contact for any questions between now and then

## 2021-02-27 NOTE — Assessment & Plan Note (Signed)
Patient with stated anxiety with reassuring PHQ and GAD scores.  I reviewed nonpharmacologic as well as pharmacologic management options and given the sporadic nature of his complaints he will utilize hydroxyzine 3 times daily on an as needed basis, we can follow-up on this issue pending response and residual symptoms if present.

## 2021-02-28 LAB — PROLACTIN: Prolactin: 8.2 ng/mL (ref 4.0–15.2)

## 2021-02-28 LAB — TESTOSTERONE: Testosterone: 481 ng/dL (ref 264–916)

## 2021-02-28 LAB — LUTEINIZING HORMONE: LH: 7.5 m[IU]/mL (ref 1.7–8.6)

## 2021-03-02 ENCOUNTER — Encounter: Payer: Self-pay | Admitting: Family Medicine

## 2021-03-02 MED ORDER — TAMOXIFEN CITRATE 10 MG PO TABS: 10.0000 mg | ORAL_TABLET | Freq: Two times a day (BID) | ORAL | 0 refills | Status: DC

## 2021-03-02 NOTE — Telephone Encounter (Signed)
Pt message.  KP

## 2021-03-03 NOTE — Telephone Encounter (Signed)
Pt response.  KP

## 2021-03-18 NOTE — Telephone Encounter (Signed)
Please advise for ongoing pain.

## 2021-03-20 NOTE — Telephone Encounter (Signed)
Please review.  KP

## 2021-03-20 NOTE — Telephone Encounter (Signed)
Spoke to patient regarding low back and legs, he states symptoms have significantly improved, I advised him that if he symptoms recur, contact us for appropriate x-rays and to schedule a visit to evaluate this. He is amenable.  In regards to his gynecomastia, he states that he has noted roughly 30% improvement and we will track this in several weeks.

## 2021-03-30 ENCOUNTER — Telehealth: Payer: Self-pay | Admitting: Family Medicine

## 2021-03-30 NOTE — Telephone Encounter (Signed)
BP Readings from Last 3 Encounters:  02/27/21 (!) 122/92  10/01/19 116/63  06/12/17 118/75    Please advise for BP.

## 2021-03-30 NOTE — Telephone Encounter (Signed)
Please let patient know that one number of his blood pressure was elevated and typically if elevations are seen on a consistent basis then we discuss treatment.   If he is having any symptoms, chest pain, shortness of breath, sweats, activity-related chest discomfort, he needs to let us know. If symptoms are severe, ER visit recommended.  Have him set up an in person visit if concerns persist. Thanks

## 2021-03-30 NOTE — Telephone Encounter (Signed)
Sent detailed MyChart message notifying patient of Dr. Ashley Royalty recommendations.  Encounter dated 02/27/21 due to extension of another question/concern patient had previously.

## 2021-04-03 ENCOUNTER — Ambulatory Visit: Payer: Self-pay | Admitting: Family Medicine

## 2021-04-13 ENCOUNTER — Other Ambulatory Visit: Payer: Self-pay | Admitting: Family Medicine

## 2021-04-13 DIAGNOSIS — N62 Hypertrophy of breast: Secondary | ICD-10-CM

## 2021-04-14 ENCOUNTER — Encounter: Payer: Self-pay | Admitting: Family Medicine

## 2021-04-14 DIAGNOSIS — N62 Hypertrophy of breast: Secondary | ICD-10-CM

## 2021-04-14 MED ORDER — TAMOXIFEN CITRATE 10 MG PO TABS: 10.0000 mg | ORAL_TABLET | Freq: Two times a day (BID) | ORAL | 0 refills | Status: AC

## 2021-04-14 NOTE — Telephone Encounter (Signed)
Please advise for medication management.  No follow-up scheduled.

## 2021-04-14 NOTE — Telephone Encounter (Signed)
Please advise.  Patient has [employer] which is probably why he was charged the full amount.

## 2023-03-10 ENCOUNTER — Ambulatory Visit: Payer: BLUE CROSS/BLUE SHIELD | Admitting: Urology

## 2023-03-10 ENCOUNTER — Encounter: Payer: Self-pay | Admitting: Urology
